# Patient Record
Sex: Female | Born: 1960 | Race: White | Hispanic: No | Marital: Married
Health system: Southern US, Community
[De-identification: ages and names within clinical notes are randomized; demographics above are authoritative.]

---

## 2015-11-01 ENCOUNTER — Other Ambulatory Visit (HOSPITAL_COMMUNITY): Payer: Medicaid Other

## 2015-11-01 ENCOUNTER — Inpatient Hospital Stay
Admission: AD | Admit: 2015-11-01 | Discharge: 2015-12-20 | Disposition: E | Payer: Medicaid Other | Source: Ambulatory Visit | Attending: Internal Medicine | Admitting: Internal Medicine

## 2015-11-01 DIAGNOSIS — Z4659 Encounter for fitting and adjustment of other gastrointestinal appliance and device: Secondary | ICD-10-CM

## 2015-11-01 DIAGNOSIS — R509 Fever, unspecified: Secondary | ICD-10-CM

## 2015-11-01 DIAGNOSIS — J9690 Respiratory failure, unspecified, unspecified whether with hypoxia or hypercapnia: Secondary | ICD-10-CM

## 2015-11-01 DIAGNOSIS — Z931 Gastrostomy status: Secondary | ICD-10-CM

## 2015-11-01 MED ORDER — DIATRIZOATE MEGLUMINE & SODIUM 66-10 % PO SOLN
ORAL | Status: AC
  Filled 2015-11-01: qty 30

## 2015-11-02 ENCOUNTER — Other Ambulatory Visit (HOSPITAL_COMMUNITY): Payer: Medicaid Other

## 2015-11-02 LAB — COMPREHENSIVE METABOLIC PANEL
ALK PHOS: 57 U/L (ref 38–126)
ALT: 20 U/L (ref 14–54)
AST: 28 U/L (ref 15–41)
Albumin: 2.5 g/dL — ABNORMAL LOW (ref 3.5–5.0)
Anion gap: 12 (ref 5–15)
BILIRUBIN TOTAL: 0.8 mg/dL (ref 0.3–1.2)
BUN: 99 mg/dL — AB (ref 6–20)
CALCIUM: 8.9 mg/dL (ref 8.9–10.3)
CHLORIDE: 102 mmol/L (ref 101–111)
CO2: 23 mmol/L (ref 22–32)
CREATININE: 1.56 mg/dL — AB (ref 0.44–1.00)
GFR, EST AFRICAN AMERICAN: 42 mL/min — AB (ref >60)
GFR, EST NON AFRICAN AMERICAN: 36 mL/min — AB (ref >60)
Glucose, Bld: 81 mg/dL (ref 65–99)
Potassium: 3.7 mmol/L (ref 3.5–5.1)
Sodium: 137 mmol/L (ref 135–145)
TOTAL PROTEIN: 4.3 g/dL — AB (ref 6.5–8.1)

## 2015-11-02 LAB — CBC
HCT: 25.1 % — ABNORMAL LOW (ref 36.0–46.0)
Hemoglobin: 8.1 g/dL — ABNORMAL LOW (ref 12.0–15.0)
MCH: 29.3 pg (ref 26.0–34.0)
MCHC: 32.3 g/dL (ref 30.0–36.0)
MCV: 90.9 fL (ref 78.0–100.0)
PLATELETS: 201 10*3/uL (ref 150–400)
RBC: 2.76 MIL/uL — ABNORMAL LOW (ref 3.87–5.11)
RDW: 18.7 % — AB (ref 11.5–15.5)
WBC: 11.8 10*3/uL — AB (ref 4.0–10.5)

## 2015-11-03 LAB — BASIC METABOLIC PANEL
ANION GAP: 11 (ref 5–15)
BUN: 84 mg/dL — ABNORMAL HIGH (ref 6–20)
CALCIUM: 9.5 mg/dL (ref 8.9–10.3)
CHLORIDE: 98 mmol/L — AB (ref 101–111)
CO2: 27 mmol/L (ref 22–32)
Creatinine, Ser: 1.35 mg/dL — ABNORMAL HIGH (ref 0.44–1.00)
GFR calc Af Amer: 49 mL/min — ABNORMAL LOW (ref >60)
GFR, EST NON AFRICAN AMERICAN: 43 mL/min — AB (ref >60)
GLUCOSE: 115 mg/dL — AB (ref 65–99)
POTASSIUM: 3.2 mmol/L — AB (ref 3.5–5.1)
Sodium: 136 mmol/L (ref 135–145)

## 2015-11-03 LAB — TROPONIN I
TROPONIN I: 0.37 ng/mL — AB (ref <0.03)
Troponin I: 0.33 ng/mL (ref <0.03)

## 2015-11-03 LAB — CK TOTAL AND CKMB (NOT AT ARMC)
CK TOTAL: 18 U/L — AB (ref 38–234)
CK, MB: 4 ng/mL (ref 0.5–5.0)
Relative Index: INVALID (ref 0.0–2.5)

## 2015-11-04 LAB — POTASSIUM: Potassium: 3 mmol/L — ABNORMAL LOW (ref 3.5–5.1)

## 2015-11-04 LAB — TROPONIN I: TROPONIN I: 0.27 ng/mL — AB (ref <0.03)

## 2015-11-05 LAB — BASIC METABOLIC PANEL
ANION GAP: 9 (ref 5–15)
BUN: 69 mg/dL — ABNORMAL HIGH (ref 6–20)
CALCIUM: 9.6 mg/dL (ref 8.9–10.3)
CHLORIDE: 94 mmol/L — AB (ref 101–111)
CO2: 33 mmol/L — AB (ref 22–32)
Creatinine, Ser: 1.23 mg/dL — ABNORMAL HIGH (ref 0.44–1.00)
GFR calc non Af Amer: 48 mL/min — ABNORMAL LOW (ref >60)
GFR, EST AFRICAN AMERICAN: 55 mL/min — AB (ref >60)
Glucose, Bld: 84 mg/dL (ref 65–99)
Potassium: 2.7 mmol/L — CL (ref 3.5–5.1)
Sodium: 136 mmol/L (ref 135–145)

## 2015-11-05 LAB — CBC
HEMATOCRIT: 26.3 % — AB (ref 36.0–46.0)
HEMOGLOBIN: 8.3 g/dL — AB (ref 12.0–15.0)
MCH: 29.5 pg (ref 26.0–34.0)
MCHC: 31.6 g/dL (ref 30.0–36.0)
MCV: 93.6 fL (ref 78.0–100.0)
Platelets: 183 10*3/uL (ref 150–400)
RBC: 2.81 MIL/uL — AB (ref 3.87–5.11)
RDW: 17.7 % — ABNORMAL HIGH (ref 11.5–15.5)
WBC: 14 10*3/uL — AB (ref 4.0–10.5)

## 2015-11-05 LAB — MAGNESIUM: Magnesium: 2.1 mg/dL (ref 1.7–2.4)

## 2015-11-06 LAB — BASIC METABOLIC PANEL
ANION GAP: 11 (ref 5–15)
BUN: 58 mg/dL — ABNORMAL HIGH (ref 6–20)
CALCIUM: 9.8 mg/dL (ref 8.9–10.3)
CO2: 33 mmol/L — AB (ref 22–32)
CREATININE: 1.06 mg/dL — AB (ref 0.44–1.00)
Chloride: 93 mmol/L — ABNORMAL LOW (ref 101–111)
GFR, EST NON AFRICAN AMERICAN: 57 mL/min — AB (ref >60)
Glucose, Bld: 84 mg/dL (ref 65–99)
Potassium: 3.4 mmol/L — ABNORMAL LOW (ref 3.5–5.1)
SODIUM: 137 mmol/L (ref 135–145)

## 2015-11-06 NOTE — Consult Note (Signed)
[facility] CONSULT NOTE    Date: 11/06/2015                  Patient Name:  Diane Cunningham  MRN: [id number]  DOB: 05-05-1960  Age / Sex: 55 y.o., female         PCP: No primary care provider on file.                 Service Requesting Consult: Hospitalist/Dr. Hijazi                 Reason for Consult: Acute renal failure            History of Present Illness: Patient is a 55 y.o. female with a PMHx of seizure disorder, fibromyalgia, who was admitted originally to [facility] in May of 2017. At that time she was found to have pneumonia and started on antibiotics. She developed respiratory failure and failed to wean from mechanical ventilation. She was then transferred to [facility] for ventilator weaning. Unfortunately she then developed fever and hypotension and was found to have worsening leukocytosis as well as acute renal failure. She was then sent over to [facility]  She was initially started on continuous renal replacement therapy. In addition she was found to have thrombocytopenia in the setting of multiple organ failure. She underwent plasma exchange x3. She was found to have Klebsiella sepsis.  It was felt that she may end up requiring additional renal replacement therapy however her urine output here has picked up to 2000 cc over the preceding 24 hours. She has considerable urine in the Foley bag today as well. She remains on the ventilator at the moment via a tracheostomy.  Medications: Meropenem 500 mg IV twice a day, polymyxin 1.4 million units twice a day, Humalog insulin, Xanax 0.5 mg every 6 hours, amiodarone 400 mg twice a day, Lasix 40 mg twice a day, Neurontin 200 mg each bedtime, heparin 5000 units every 8 hours,, Keppra 500 mg twice a day, magnesium oxide 800 mg twice a day, melatonin 3 mg each bedtime, metoprolol 25 mg every 8 hours, prednisone 40 mg daily, thiamine 100 mg daily   Allergies: No known drug  allergies   Past Medical History: Seizure disorder Fibromyalgia Recent Klebsiella sepsis Acute renal failure secondary to ATN Respiratory failure status post tracheostomy placement Thrombocytopenia treated with plasma exchange  Past Surgical History: Tracheostomy placement   Family History: Unable to obtain from the patient as she's currently on the ventilator.  Social History: Social History   Social History  . Marital Status: Married    Spouse Name: N/A  . Number of Children: N/A  . Years of Education: N/A   Occupational History  . Not on file.   Social History Main Topics  . Smoking status: Not on file  . Smokeless tobacco: Not on file  . Alcohol Use: Not on file  . Drug Use: Not on file  . Sexual Activity: Not on file   Other Topics Concern  . Not on file   Social History Narrative  . No narrative on file     Review of Systems: Unable to obtain from the patient is currently on a ventilator.  Vital Signs: Temperature 97.1 pulse 70 respirations 14 blood pressure 144/72 Weight trends: There were no vitals filed for this visit.  Physical Exam: General: Ill appearing  Head: Normocephalic, atraumatic.  Eyes: Anicteric, EOMI  Nose: Mucous membranes moist, not inflammed, nonerythematous. NG in  place  Throat: Oropharynx nonerythematous, no exudate appreciated.   Neck: Supple, trachea midline.  Lungs:  Scattered rhonchi, ventilator assisted.  Heart: S1S2 irregular  Abdomen:  BS normoactive. Soft, Nondistended, non-tender.  No masses or organomegaly.  Extremities: 2+ edema b/l LE's.  Neurologic: Awake, does follow simple commands with hands and feet  Skin: No visible rashes, scars.    Lab results: Basic Metabolic Panel:  Recent Labs Lab 11/03/15 0540 11/04/15 0500 11/05/15 0630 11/06/15 0645  NA 136  --  136 137  K 3.2* 3.0* 2.7* 3.4*  CL 98*  --  94* 93*  CO2 27  --  33* 33*  GLUCOSE 115*  --  84 84  BUN 84*  --  69* 58*  CREATININE 1.35*   --  1.23* 1.06*  CALCIUM 9.5  --  9.6 9.8  MG  --   --  2.1  --     Liver Function Tests:  Recent Labs Lab 11/02/15 0719  AST 28  ALT 20  ALKPHOS 57  BILITOT 0.8  PROT 4.3*  ALBUMIN 2.5*   No results for input(s): LIPASE, AMYLASE in the last 168 hours. No results for input(s): AMMONIA in the last 168 hours.  CBC:  Recent Labs Lab 11/02/15 0719 11/05/15 0630  WBC 11.8* 14.0*  HGB 8.1* 8.3*  HCT 25.1* 26.3*  MCV 90.9 93.6  PLT 201 183    Cardiac Enzymes:  Recent Labs Lab 11/03/15 0540 11/03/15 1445 11/04/15 0500  CKTOTAL  --  18*  --   CKMB  --  4.0  --   TROPONINI 0.37* 0.33* 0.27*    BNP: Invalid input(s): POCBNP  CBG: No results for input(s): GLUCAP in the last 168 hours.  Microbiology: No results found for this or any previous visit.  Coagulation Studies: No results for input(s): LABPROT, INR in the last 72 hours.  Urinalysis: No results for input(s): COLORURINE, LABSPEC, PHURINE, GLUCOSEU, HGBUR, BILIRUBINUR, KETONESUR, PROTEINUR, UROBILINOGEN, NITRITE, LEUKOCYTESUR in the last 72 hours.  Invalid input(s): APPERANCEUR    Imaging:  No results found.   Assessment & Plan: Pt is a 55 y.o. female with a PMHx of seizure disorder, fibromyalgia, who was admitted originally to [facility] in May of 2017. At that time she was found to have pneumonia and started on antibiotics. She developed respiratory failure and failed to wean from mechanical ventilation. She was then transferred to [facility] for ventilator weaning. Unfortunately she then developed fever and hypotension and was found to have worsening leukocytosis as well as acute renal failure. She was then sent over to [facility].  She was initially started on continuous renal replacement therapy. In addition she was found to have thrombocytopenia in the setting of multiple organ failure. She underwent plasma exchange x3. She was found to have Klebsiella sepsis.  1. Acute  renal failure. This appears to be multifactorial. Patient was treated with continuous renal replacement therapy at [facility]. - BUN/creatinine appear to be trending down. Good urine output also noted. Therefore no urgent indication for any further dialysis at this time. Avoid nephrotoxins as possible. Those medications for renal function.  2.acute respiratory failure. Patient has tracheostomy in place and remains on the ventilator. Weaning from the ventilator per pulmonary critical care.  3. Hypokalemia. Serum potassium 3.4 today. Continue repletion efforts and continue to monitor serum potassium.  4. Anemia unspecified. Likely related to acute renal flare and decreased erythropoietin production. No urgent indication to start Aranesp now however would  recommend continue monitoring of hemoglobin.  5. Thanks for consultation.

## 2015-11-07 ENCOUNTER — Other Ambulatory Visit (HOSPITAL_COMMUNITY): Payer: Medicaid Other

## 2015-11-08 NOTE — Consult Note (Signed)
[facility]  ROUNDING NOTE   Subjective:  The patient now off the ventilator. She is awake and alert. NG remains in place.    Objective:  Vital signs in last 24 hours:   temperature 97.3 pulse 64 respirations 16 blood pressure 133/76  Physical Exam: General: Chronically ill appearing  Head: Normocephalic, atraumatic. NG in place  Eyes: Anicteric  Neck: Tracheostomy in place  Lungs:  Scattered rhonchi, normal effort  Heart: S1S2 no rubs  Abdomen:  Soft, nontender, BS present   Extremities: 1+ peripheral edema.  Neurologic: Awake, alert, follows simple commands  Skin: No lesions       Basic Metabolic Panel:  Recent Labs Lab 11/02/15 0719 11/03/15 0540 11/04/15 0500 11/05/15 0630 11/06/15 0645  NA 137 136  --  136 137  K 3.7 3.2* 3.0* 2.7* 3.4*  CL 102 98*  --  94* 93*  CO2 23 27  --  33* 33*  GLUCOSE 81 115*  --  84 84  BUN 99* 84*  --  69* 58*  CREATININE 1.56* 1.35*  --  1.23* 1.06*  CALCIUM 8.9 9.5  --  9.6 9.8  MG  --   --   --  2.1  --     Liver Function Tests:  Recent Labs Lab 11/02/15 0719  AST 28  ALT 20  ALKPHOS 57  BILITOT 0.8  PROT 4.3*  ALBUMIN 2.5*   No results for input(s): LIPASE, AMYLASE in the last 168 hours. No results for input(s): AMMONIA in the last 168 hours.  CBC:  Recent Labs Lab 11/02/15 0719 11/05/15 0630  WBC 11.8* 14.0*  HGB 8.1* 8.3*  HCT 25.1* 26.3*  MCV 90.9 93.6  PLT 201 183    Cardiac Enzymes:  Recent Labs Lab 11/03/15 0540 11/03/15 1445 11/04/15 0500  CKTOTAL  --  18*  --   CKMB  --  4.0  --   TROPONINI 0.37* 0.33* 0.27*    BNP: Invalid input(s): POCBNP  CBG: No results for input(s): GLUCAP in the last 168 hours.  Microbiology: No results found for this or any previous visit.  Coagulation Studies: No results for input(s): LABPROT, INR in the last 72 hours.  Urinalysis: No results for input(s): COLORURINE, LABSPEC, PHURINE, GLUCOSEU, HGBUR, BILIRUBINUR, KETONESUR,  PROTEINUR, UROBILINOGEN, NITRITE, LEUKOCYTESUR in the last 72 hours.  Invalid input(s): APPERANCEUR    Imaging: Dg Abd Portable 1v  11/07/2015  CLINICAL DATA:  NG tube placement. EXAM: PORTABLE ABDOMEN - 1 VIEW COMPARISON:  10/19/2015 FINDINGS: The NG tube tip is in the body region of the stomach. Air-filled small bowel loops are noted in the central abdomen. No free air. IMPRESSION: NG tube tip is in the body region of the stomach. Electronically Signed   By: Rudie MeyerP.  Gallerani M.D.   On: 11/07/2015 15:16     Medications:       Assessment/ Plan:  55 y.o. female with a PMHx of seizure disorder, fibromyalgia, who was admitted originally to [facility] in May of 2017. At that time she was found to have pneumonia and started on antibiotics. She developed respiratory failure and failed to wean from mechanical ventilation. She was then transferred to [facility] for ventilator weaning. Unfortunately she then developed fever and hypotension and was found to have worsening leukocytosis as well as acute renal failure. She was then sent over to [facility]. She was initially started on continuous renal replacement therapy. In addition she was found to have thrombocytopenia in the  setting of multiple organ failure. She underwent plasma exchange x3. She was found to have Klebsiella sepsis.  1. Acute renal failure. This appears to be multifactorial. Patient was treated with continuous renal replacement therapy at [facility]. - BUN likely high secondary to tube feedings. Creatinine down to 1.06. Good urine output noted. No indication for dialysis at the moment. Continue to monitor renal function trend and avoid nephrotoxins as possible.  2.acute respiratory failure. Currently off the ventilator and on T. Piece. Continue to monitor her progress.  3. Hypokalemia. No new serum potassium today. Recommend continued monitoring.  4. Anemia unspecified.  Most recent hemoglobin  was 8.3. Continue to monitor. Consider Aranesp if hemoglobin remains low.   LOS:  Diane Cunningham 7/21/20179:02 AM

## 2015-11-11 LAB — BASIC METABOLIC PANEL
ANION GAP: 13 (ref 5–15)
BUN: 66 mg/dL — ABNORMAL HIGH (ref 6–20)
CO2: 36 mmol/L — ABNORMAL HIGH (ref 22–32)
Calcium: 9.9 mg/dL (ref 8.9–10.3)
Chloride: 89 mmol/L — ABNORMAL LOW (ref 101–111)
Creatinine, Ser: 1.74 mg/dL — ABNORMAL HIGH (ref 0.44–1.00)
GFR calc Af Amer: 36 mL/min — ABNORMAL LOW (ref >60)
GFR, EST NON AFRICAN AMERICAN: 31 mL/min — AB (ref >60)
GLUCOSE: 82 mg/dL (ref 65–99)
POTASSIUM: 2.3 mmol/L — AB (ref 3.5–5.1)
Sodium: 138 mmol/L (ref 135–145)

## 2015-11-11 LAB — CBC
HCT: 21.7 % — ABNORMAL LOW (ref 36.0–46.0)
Hemoglobin: 7.1 g/dL — ABNORMAL LOW (ref 12.0–15.0)
MCH: 29.7 pg (ref 26.0–34.0)
MCHC: 32.7 g/dL (ref 30.0–36.0)
MCV: 90.8 fL (ref 78.0–100.0)
PLATELETS: 191 10*3/uL (ref 150–400)
RBC: 2.39 MIL/uL — AB (ref 3.87–5.11)
RDW: 15.6 % — ABNORMAL HIGH (ref 11.5–15.5)
WBC: 7 10*3/uL (ref 4.0–10.5)

## 2015-11-11 NOTE — Progress Notes (Signed)
[facility]  ROUNDING NOTE   Subjective:  The patient now off the ventilator. She is awake and alert. NG remains in place. She has a speech valve and is able to verbalize Today's labs show low potassium at 2.3 Serum creatinine has increased to 1.74 compared to 1.06 on July 19 Urine output recorded at 2270 cc last 24 hours Prior to that urine output was 1850 cc   Objective:  Vital signs in last 24 hours:  temperature 98.4 pulse 54, respirations 15, blood pressure 109/59   Physical Exam: General: Chronically ill appearing  Head: Normocephalic, atraumatic. NG in place  Eyes: Anicteric  Neck: Tracheostomy in place with speech valve  Lungs:  Scattered rhonchi, normal effort  Heart: S1S2 no rubs  Abdomen:  Soft, nontender, BS present , Colostomy present  Extremities: 1+ peripheral edema.  Neurologic: Awake, alert, follows simple commands  Skin: No acute lesions       Basic Metabolic Panel:  Recent Labs Lab 11/05/15 0630 11/06/15 0645 11/11/15 0532  NA 136 137 138  K 2.7* 3.4* 2.3*  CL 94* 93* 89*  CO2 33* 33* 36*  GLUCOSE 84 84 82  BUN 69* 58* 66*  CREATININE 1.23* 1.06* 1.74*  CALCIUM 9.6 9.8 9.9  MG 2.1  --   --     Liver Function Tests: No results for input(s): AST, ALT, ALKPHOS, BILITOT, PROT, ALBUMIN in the last 168 hours. No results for input(s): LIPASE, AMYLASE in the last 168 hours. No results for input(s): AMMONIA in the last 168 hours.  CBC:  Recent Labs Lab 11/05/15 0630 11/11/15 0532  WBC 14.0* 7.0  HGB 8.3* 7.1*  HCT 26.3* 21.7*  MCV 93.6 90.8  PLT 183 191    Cardiac Enzymes: No results for input(s): CKTOTAL, CKMB, CKMBINDEX, TROPONINI in the last 168 hours.  BNP: Invalid input(s): POCBNP  CBG: No results for input(s): GLUCAP in the last 168 hours.  Microbiology: No results found for this or any previous visit.  Coagulation Studies: No results for input(s): LABPROT, INR in the last 72 hours.  Urinalysis: No  results for input(s): COLORURINE, LABSPEC, PHURINE, GLUCOSEU, HGBUR, BILIRUBINUR, KETONESUR, PROTEINUR, UROBILINOGEN, NITRITE, LEUKOCYTESUR in the last 72 hours.  Invalid input(s): APPERANCEUR    Imaging: No results found.   Medications:       Assessment/ Plan:  55 y.o. female with a PMHx of seizure disorder, fibromyalgia, who was admitted originally to [facility] in May of 2017. At that time she was found to have pneumonia and started on antibiotics. She developed respiratory failure and failed to wean from mechanical ventilation. She was then transferred to [facility] for ventilator weaning. Unfortunately she then developed fever and hypotension and was found to have worsening leukocytosis as well as acute renal failure. She was then sent over to [facility]. She was initially started on continuous renal replacement therapy. In addition she was found to have thrombocytopenia in the setting of multiple organ failure. She underwent plasma exchange x3. She was found to have Klebsiella sepsis.  1. Acute renal failure. This appears to be multifactorial. Patient was treated with continuous renal replacement therapy at [facility]. -  Increasing creatinine and BUN is likely secondary to volume depletion We will discontinue furosemide Electrolytes and volume status are acceptable No acute indication for dialysis at present   2.acute respiratory failure. Currently off the ventilator - Continue to monitor her progress.  3. Hypokalemia.  Low due to lasix Replace as needed Should  correct now that lasix is stopped  4. Anemia unspecified.  Most recent hemoglobin was 7.1  Continue to monitor. Consider Aranesp if hemoglobin remains low.   LOS: 0 Diane Cunningham 7/24/20172:48 PM

## 2015-11-12 LAB — BASIC METABOLIC PANEL
ANION GAP: 9 (ref 5–15)
BUN: 59 mg/dL — ABNORMAL HIGH (ref 6–20)
CALCIUM: 9.8 mg/dL (ref 8.9–10.3)
CO2: 34 mmol/L — ABNORMAL HIGH (ref 22–32)
Chloride: 95 mmol/L — ABNORMAL LOW (ref 101–111)
Creatinine, Ser: 1.74 mg/dL — ABNORMAL HIGH (ref 0.44–1.00)
GFR, EST AFRICAN AMERICAN: 36 mL/min — AB (ref >60)
GFR, EST NON AFRICAN AMERICAN: 31 mL/min — AB (ref >60)
Glucose, Bld: 83 mg/dL (ref 65–99)
POTASSIUM: 3.7 mmol/L (ref 3.5–5.1)
SODIUM: 138 mmol/L (ref 135–145)

## 2015-11-12 LAB — MAGNESIUM: MAGNESIUM: 2.2 mg/dL (ref 1.7–2.4)

## 2015-11-12 LAB — POTASSIUM: POTASSIUM: 3.4 mmol/L — AB (ref 3.5–5.1)

## 2015-11-13 ENCOUNTER — Other Ambulatory Visit (HOSPITAL_COMMUNITY): Payer: Medicaid Other

## 2015-11-13 LAB — BASIC METABOLIC PANEL
Anion gap: 13 (ref 5–15)
BUN: 54 mg/dL — AB (ref 6–20)
CHLORIDE: 95 mmol/L — AB (ref 101–111)
CO2: 31 mmol/L (ref 22–32)
CREATININE: 1.59 mg/dL — AB (ref 0.44–1.00)
Calcium: 9.9 mg/dL (ref 8.9–10.3)
GFR calc Af Amer: 41 mL/min — ABNORMAL LOW (ref >60)
GFR calc non Af Amer: 35 mL/min — ABNORMAL LOW (ref >60)
Glucose, Bld: 72 mg/dL (ref 65–99)
Potassium: 4.4 mmol/L (ref 3.5–5.1)
Sodium: 139 mmol/L (ref 135–145)

## 2015-11-13 LAB — MAGNESIUM: Magnesium: 2.2 mg/dL (ref 1.7–2.4)

## 2015-11-13 NOTE — Progress Notes (Signed)
[facility]  ROUNDING NOTE   Subjective:  The patient now off the ventilator. She is awake and alert.   She has a speech valve and is able to verbalize Today's labs show low potassium has improved to 4.4 Serum creatinine has improved to 1.59 after diuretic was stopped     Objective:  Vital signs in last 24 hours:  temperature 97.5 pulse 56, respirations 19, blood pressure 102/52   Physical Exam: General: Chronically ill appearing  Head: Normocephalic, atraumatic.    Eyes: Anicteric  Neck: Tracheostomy in place with speech valve  Lungs:  Scattered rhonchi, normal effort  Heart: S1S2 no rubs  Abdomen:  Soft, nontender, BS present , Colostomy present  Extremities: 1+ peripheral edema.  Neurologic: Awake, alert, follows simple commands  Skin: No acute lesions   Rt IJ dialysis catheter    Basic Metabolic Panel:  Recent Labs Lab 11/11/15 0532 11/12/15 0001 11/12/15 0830 11/13/15 0440  NA 138  --  138 139  K 2.3* 3.4* 3.7 4.4  CL 89*  --  95* 95*  CO2 36*  --  34* 31  GLUCOSE 82  --  83 72  BUN 66*  --  59* 54*  CREATININE 1.74*  --  1.74* 1.59*  CALCIUM 9.9  --  9.8 9.9  MG  --   --  2.2 2.2    Liver Function Tests: No results for input(s): AST, ALT, ALKPHOS, BILITOT, PROT, ALBUMIN in the last 168 hours. No results for input(s): LIPASE, AMYLASE in the last 168 hours. No results for input(s): AMMONIA in the last 168 hours.  CBC:  Recent Labs Lab 11/11/15 0532  WBC 7.0  HGB 7.1*  HCT 21.7*  MCV 90.8  PLT 191    Cardiac Enzymes: No results for input(s): CKTOTAL, CKMB, CKMBINDEX, TROPONINI in the last 168 hours.  BNP: Invalid input(s): POCBNP  CBG: No results for input(s): GLUCAP in the last 168 hours.  Microbiology: No results found for this or any previous visit.  Coagulation Studies: No results for input(s): LABPROT, INR in the last 72 hours.  Urinalysis: No results for input(s): COLORURINE, LABSPEC, PHURINE, GLUCOSEU, HGBUR,  BILIRUBINUR, KETONESUR, PROTEINUR, UROBILINOGEN, NITRITE, LEUKOCYTESUR in the last 72 hours.  Invalid input(s): APPERANCEUR    Imaging: No results found.   Medications:       Assessment/ Plan:  55 y.o. female with a PMHx of seizure disorder, fibromyalgia, who was admitted originally to [facility] in May of 2017. At that time she was found to have pneumonia and started on antibiotics. She developed respiratory failure and failed to wean from mechanical ventilation. She was then transferred to [facility] for ventilator weaning. Unfortunately she then developed fever and hypotension and was found to have worsening leukocytosis as well as acute renal failure. She was then sent over to [facility]. She was initially started on continuous renal replacement therapy. In addition she was found to have thrombocytopenia in the setting of multiple organ failure. She underwent plasma exchange x3. She was found to have Klebsiella sepsis.  1. Acute renal failure. This appears to be multifactorial. Patient was treated with continuous renal replacement therapy at [facility]. -  Increasing creatinine and BUN is likely secondary to volume depletion - stay off of furosemide Electrolytes and volume status are acceptable No acute indication for dialysis at present Will probably remove dialysis cathter in next 1-2 days if Cr remains stable  2. Acute respiratory failure. Currently off the ventilator -  Continue to monitor her progress.  3. Hypokalemia.  Corrected now      LOS: 0 Aristides Luckey 7/26/20173:36 PM

## 2015-11-15 NOTE — Progress Notes (Signed)
[facility]  ROUNDING NOTE   Subjective:  The patient now off the ventilator. She is awake and alert.   She has a speech valve and is able to verbalize. States she can eat pureed diet Labs from 7/26 show K 4.4, Cr improved to 1.59 after diuretic was stopped  no new labs today   Objective:  Vital signs in last 24 hours:  Temperature 97.3, pulse 74, respirations 16, blood pressure 108/68  Physical Exam: General: Chronically ill appearing  Head: Normocephalic, atraumatic.    Eyes: Anicteric  Neck: Tracheostomy in place with speech valve  Lungs:  Scattered rhonchi, normal effort  Heart: S1S2 no rubs  Abdomen:  Soft, nontender, BS present , Colostomy present  Extremities: 1+ peripheral edema.  Neurologic: Awake, alert, follows simple commands  Skin: No acute lesions   Rt IJ dialysis catheter    Basic Metabolic Panel:  Recent Labs Lab 11/11/15 0532 11/12/15 0001 11/12/15 0830 11/13/15 0440  NA 138  --  138 139  K 2.3* 3.4* 3.7 4.4  CL 89*  --  95* 95*  CO2 36*  --  34* 31  GLUCOSE 82  --  83 72  BUN 66*  --  59* 54*  CREATININE 1.74*  --  1.74* 1.59*  CALCIUM 9.9  --  9.8 9.9  MG  --   --  2.2 2.2    Liver Function Tests: No results for input(s): AST, ALT, ALKPHOS, BILITOT, PROT, ALBUMIN in the last 168 hours. No results for input(s): LIPASE, AMYLASE in the last 168 hours. No results for input(s): AMMONIA in the last 168 hours.  CBC:  Recent Labs Lab 11/11/15 0532  WBC 7.0  HGB 7.1*  HCT 21.7*  MCV 90.8  PLT 191    Cardiac Enzymes: No results for input(s): CKTOTAL, CKMB, CKMBINDEX, TROPONINI in the last 168 hours.  BNP: Invalid input(s): POCBNP  CBG: No results for input(s): GLUCAP in the last 168 hours.  Microbiology: No results found for this or any previous visit.  Coagulation Studies: No results for input(s): LABPROT, INR in the last 72 hours.  Urinalysis: No results for input(s): COLORURINE, LABSPEC, PHURINE, GLUCOSEU,  HGBUR, BILIRUBINUR, KETONESUR, PROTEINUR, UROBILINOGEN, NITRITE, LEUKOCYTESUR in the last 72 hours.  Invalid input(s): APPERANCEUR    Imaging: No results found.   Medications:       Assessment/ Plan:  55 y.o. female with a PMHx of seizure disorder, fibromyalgia, who was admitted originally to [facility] in May of 2017. At that time she was found to have pneumonia and started on antibiotics. She developed respiratory failure and failed to wean from mechanical ventilation. She was then transferred to [facility] for ventilator weaning. Unfortunately she then developed fever and hypotension and was found to have worsening leukocytosis as well as acute renal failure. She was then sent over to [facility]. She was initially started on continuous renal replacement therapy. In addition she was found to have thrombocytopenia in the setting of multiple organ failure. She underwent plasma exchange x3. She was found to have Klebsiella sepsis.  1. Acute renal failure. This appears to be multifactorial. Patient was treated with continuous renal replacement therapy at [facility]. -  Increasing creatinine and BUN is likely secondary to volume depletion - stay off of furosemide Electrolytes and volume status are acceptable No acute indication for dialysis at present Will probably remove dialysis cathterom Monday if Cr remains stable No new labs since 7/26 Will order  2. Acute  respiratory failure. Currently off the ventilator - Continue to monitor her progress.  3. Hypokalemia.  Corrected with replacement     LOS: 0 Diane Cunningham 7/28/20173:36 PM

## 2015-11-16 ENCOUNTER — Other Ambulatory Visit (HOSPITAL_COMMUNITY): Payer: Medicaid Other

## 2015-11-16 LAB — BASIC METABOLIC PANEL
ANION GAP: 9 (ref 5–15)
BUN: 30 mg/dL — ABNORMAL HIGH (ref 6–20)
CALCIUM: 9.3 mg/dL (ref 8.9–10.3)
CHLORIDE: 103 mmol/L (ref 101–111)
CO2: 30 mmol/L (ref 22–32)
CREATININE: 1.64 mg/dL — AB (ref 0.44–1.00)
GFR calc non Af Amer: 34 mL/min — ABNORMAL LOW (ref >60)
GFR, EST AFRICAN AMERICAN: 39 mL/min — AB (ref >60)
Glucose, Bld: 74 mg/dL (ref 65–99)
Potassium: 3 mmol/L — ABNORMAL LOW (ref 3.5–5.1)
SODIUM: 142 mmol/L (ref 135–145)

## 2015-11-16 LAB — CBC
HCT: 22.7 % — ABNORMAL LOW (ref 36.0–46.0)
HEMOGLOBIN: 7.1 g/dL — AB (ref 12.0–15.0)
MCH: 29.7 pg (ref 26.0–34.0)
MCHC: 31.3 g/dL (ref 30.0–36.0)
MCV: 95 fL (ref 78.0–100.0)
Platelets: 206 10*3/uL (ref 150–400)
RBC: 2.39 MIL/uL — ABNORMAL LOW (ref 3.87–5.11)
RDW: 16.2 % — AB (ref 11.5–15.5)
WBC: 4.9 10*3/uL (ref 4.0–10.5)

## 2015-11-17 LAB — RENAL FUNCTION PANEL
ANION GAP: 10 (ref 5–15)
Albumin: 3.1 g/dL — ABNORMAL LOW (ref 3.5–5.0)
BUN: 25 mg/dL — AB (ref 6–20)
CALCIUM: 9 mg/dL (ref 8.9–10.3)
CO2: 26 mmol/L (ref 22–32)
Chloride: 107 mmol/L (ref 101–111)
Creatinine, Ser: 1.59 mg/dL — ABNORMAL HIGH (ref 0.44–1.00)
GFR calc Af Amer: 41 mL/min — ABNORMAL LOW (ref >60)
GFR calc non Af Amer: 35 mL/min — ABNORMAL LOW (ref >60)
GLUCOSE: 76 mg/dL (ref 65–99)
Phosphorus: 4.4 mg/dL (ref 2.5–4.6)
Potassium: 5.1 mmol/L (ref 3.5–5.1)
SODIUM: 143 mmol/L (ref 135–145)

## 2015-11-18 LAB — BASIC METABOLIC PANEL
Anion gap: 5 (ref 5–15)
BUN: 23 mg/dL — AB (ref 6–20)
CHLORIDE: 110 mmol/L (ref 101–111)
CO2: 30 mmol/L (ref 22–32)
CREATININE: 1.59 mg/dL — AB (ref 0.44–1.00)
Calcium: 9.1 mg/dL (ref 8.9–10.3)
GFR calc Af Amer: 41 mL/min — ABNORMAL LOW (ref >60)
GFR calc non Af Amer: 35 mL/min — ABNORMAL LOW (ref >60)
Glucose, Bld: 77 mg/dL (ref 65–99)
Potassium: 4.5 mmol/L (ref 3.5–5.1)
SODIUM: 145 mmol/L (ref 135–145)

## 2015-11-19 LAB — MAGNESIUM: MAGNESIUM: 1.9 mg/dL (ref 1.7–2.4)

## 2015-11-19 DEATH — deceased

## 2015-11-20 NOTE — Progress Notes (Signed)
[facility]  ROUNDING NOTE   Subjective:  Patient resting on the ventilator. She still has a Passy-Muir valve in place. Most recent creatinine was 1.59. We discussed removing dialysis catheter today.    Objective:  Vital signs in last 24 hours:  Temperature 99.3 pulse 82 respirations 26 blood pressure 115/68  Physical Exam: General: No acute distress  Head: Normocephalic, atraumatic. Moist oral mucosal membranes  Eyes: Anicteric  Neck: Supple, trachea midline  Lungs:  Clear to auscultation, normal effort  Heart: S1S2 no rubs  Abdomen:  Soft, nontender, bowel sounds present  Extremities:  peripheral edema.  Neurologic: Nonfocal, moving all four extremities  Skin: No lesions  Access: Right internal jugular temporary dialysis catheter    Basic Metabolic Panel:  Recent Labs Lab 11/16/15 0717 11/17/15 1524 11/18/15 0459 11/19/15 1052  NA 142 143 145  --   K 3.0* 5.1 4.5  --   CL 103 107 110  --   CO2 --   GLUCOSE 74 76 77  --   BUN 30* 25* 23*  --   CREATININE 1.64* 1.59* 1.59*  --   CALCIUM 9.3 9.0 9.1  --   MG  --   --   --  1.9  PHOS  --  4.4  --   --     Liver Function Tests:  Recent Labs Lab 11/17/15 1524  ALBUMIN 3.1*   No results for input(s): LIPASE, AMYLASE in the last 168 hours. No results for input(s): AMMONIA in the last 168 hours.  CBC:  Recent Labs Lab 11/16/15 0717  WBC 4.9  HGB 7.1*  HCT 22.7*  MCV 95.0  PLT 206    Cardiac Enzymes: No results for input(s): CKTOTAL, CKMB, CKMBINDEX, TROPONINI in the last 168 hours.  BNP: Invalid input(s): POCBNP  CBG: No results for input(s): GLUCAP in the last 168 hours.  Microbiology: No results found for this or any previous visit.  Coagulation Studies: No results for input(s): LABPROT, INR in the last 72 hours.  Urinalysis: No results for input(s): COLORURINE, LABSPEC, PHURINE, GLUCOSEU, HGBUR, BILIRUBINUR, KETONESUR, PROTEINUR, UROBILINOGEN, NITRITE,  LEUKOCYTESUR in the last 72 hours.  Invalid input(s): APPERANCEUR    Imaging: No results found.   Medications:       Assessment/ Plan:  55 y.o. female with a PMHx of seizure disorder, fibromyalgia, who was admitted originally to [facility] in May of 2017. At that time she was found to have pneumonia and started on antibiotics. She developed respiratory failure and failed to wean from mechanical ventilation. She was then transferred to [facility] for ventilator weaning. Unfortunately she then developed fever and hypotension and was found to have worsening leukocytosis as well as acute renal failure. She was then sent over to [facility]. She was initially started on continuous renal replacement therapy. In addition she was found to have thrombocytopenia in the setting of multiple organ failure. She underwent plasma exchange x3. She was found to have Klebsiella sepsis.  1. Acute renal failure. This appears to be multifactorial. Patient was treated with continuous renal replacement therapy at [facility]. - at this point in time we will go ahead and discontinue temporary dialysis catheter. Continue to periodically monitor renal function. The patient may develop a new baseline creatinine given recent severe acute renal failure.  2. Acute respiratory failure. Patient continues to do well with Passy-Muir valve in place. Continue to monitor respiratory status.  3. Hypokalemia. Most recent serum potassium  was found to be 4.5 which is acceptable. Continue to periodically monitor.   LOS: 0 Cortney Mckinney 8/2/20179:40 AM

## 2015-11-22 NOTE — Progress Notes (Signed)
  [facility]  ROUNDING NOTE   Subjective:  Patient doing much better. She is now decannulated and temporary paralysis catheter has been removed.    Objective:  Vital signs in last 24 hours:  Temperature 90.8 pulse 84 respirations 22 blood pressure 113/76  Physical Exam: General: No acute distress  Head: Normocephalic, atraumatic. Moist oral mucosal membranes  Eyes: Anicteric  Neck: decannulated  Lungs:  Clear to auscultation, normal effort  Heart: S1S2 no rubs  Abdomen:  Soft, nontender, bowel sounds present  Extremities: 1+ peripheral edema.  Neurologic: Nonfocal, moving all four extremities  Skin: No lesions  Access: Right internal jugular temporary dialysis catheter now removed    Basic Metabolic Panel:  Recent Labs Lab 11/16/15 0717 11/17/15 1524 11/18/15 0459 11/19/15 1052  NA 142 143 145  --   K 3.0* 5.1 4.5  --   CL 103 107 110  --   CO2 30 26 30   --   GLUCOSE 74 76 77  --   BUN 30* 25* 23*  --   CREATININE 1.64* 1.59* 1.59*  --   CALCIUM 9.3 9.0 9.1  --   MG  --   --   --  1.9  PHOS  --  4.4  --   --     Liver Function Tests:  Recent Labs Lab 11/17/15 1524  ALBUMIN 3.1*   No results for input(s): LIPASE, AMYLASE in the last 168 hours. No results for input(s): AMMONIA in the last 168 hours.  CBC:  Recent Labs Lab 11/16/15 0717  WBC 4.9  HGB 7.1*  HCT 22.7*  MCV 95.0  PLT 206    Cardiac Enzymes: No results for input(s): CKTOTAL, CKMB, CKMBINDEX, TROPONINI in the last 168 hours.  BNP: Invalid input(s): POCBNP  CBG: No results for input(s): GLUCAP in the last 168 hours.  Microbiology: No results found for this or any previous visit.  Coagulation Studies: No results for input(s): LABPROT, INR in the last 72 hours.  Urinalysis: No results for input(s): COLORURINE, LABSPEC, PHURINE, GLUCOSEU, HGBUR, BILIRUBINUR, KETONESUR, PROTEINUR, UROBILINOGEN, NITRITE, LEUKOCYTESUR in the last 72 hours.  Invalid input(s):  APPERANCEUR    Imaging: No results found.   Medications:       Assessment/ Plan:  55 y.o. female with a PMHx of seizure disorder, fibromyalgia, who was admitted originally to [facility] in May of 2017. At that time she was found to have pneumonia and started on antibiotics. She developed respiratory failure and failed to wean from mechanical ventilation. She was then transferred to [facility] for ventilator weaning. Unfortunately she then developed fever and hypotension and was found to have worsening leukocytosis as well as acute renal failure. She was then sent over to [facility]. She was initially started on continuous renal replacement therapy. In addition she was found to have thrombocytopenia in the setting of multiple organ failure. She underwent plasma exchange x3. She was found to have Klebsiella sepsis.  1. Acute renal failure. This appears to be multifactorial. Patient was treated with continuous renal replacement therapy at [facility]. - temporalis catheter has been removed.  No new renal function testing today. Consider periodically monitoring renal function.  2. Acute respiratory failure. Patient status post decannulation doing quite well. Continue to monitor respiratory status.  3. Hypokalemia. Recommend checking serum potassium on Monday.  LOS: 0 Desmond Tufano 8/4/20179:03 AM

## 2015-11-25 LAB — RENAL FUNCTION PANEL
ALBUMIN: 2.6 g/dL — AB (ref 3.5–5.0)
ANION GAP: 6 (ref 5–15)
BUN: 18 mg/dL (ref 6–20)
CALCIUM: 8.7 mg/dL — AB (ref 8.9–10.3)
CO2: 26 mmol/L (ref 22–32)
Chloride: 111 mmol/L (ref 101–111)
Creatinine, Ser: 1.56 mg/dL — ABNORMAL HIGH (ref 0.44–1.00)
GFR, EST AFRICAN AMERICAN: 42 mL/min — AB (ref >60)
GFR, EST NON AFRICAN AMERICAN: 36 mL/min — AB (ref >60)
GLUCOSE: 81 mg/dL (ref 65–99)
Phosphorus: 4.2 mg/dL (ref 2.5–4.6)
Potassium: 3.6 mmol/L (ref 3.5–5.1)
SODIUM: 143 mmol/L (ref 135–145)

## 2015-11-25 NOTE — Progress Notes (Signed)
  [facility]  ROUNDING NOTE   Subjective:  Renal function was rechecked this a.m. Creatinine remained stable at 1.5. Overall progressing quite well after decannulation and dialysis catheter removal.    Objective:  Vital signs in last 24 hours:  Temperature 90.8 pulse 84 respirations 22 blood pressure 113/76  Physical Exam: General: No acute distress  Head: Normocephalic, atraumatic. Moist oral mucosal membranes  Eyes: Anicteric  Neck: decannulated  Lungs:  Clear to auscultation, normal effort  Heart: S1S2 no rubs  Abdomen:  Soft, nontender, bowel sounds present  Extremities: 1+ peripheral edema.  Neurologic: Nonfocal, moving all four extremities  Skin: No lesions  Access: none    Basic Metabolic Panel:  Recent Labs Lab 11/19/15 1052 11/25/15 0537  NA  --  143  K  --  3.6  CL  --  111  CO2  --  26  GLUCOSE  --  81  BUN  --  18  CREATININE  --  1.56*  CALCIUM  --  8.7*  MG 1.9  --   PHOS  --  4.2    Liver Function Tests:  Recent Labs Lab 11/25/15 0537  ALBUMIN 2.6*   No results for input(s): LIPASE, AMYLASE in the last 168 hours. No results for input(s): AMMONIA in the last 168 hours.  CBC: No results for input(s): WBC, NEUTROABS, HGB, HCT, MCV, PLT in the last 168 hours.  Cardiac Enzymes: No results for input(s): CKTOTAL, CKMB, CKMBINDEX, TROPONINI in the last 168 hours.  BNP: Invalid input(s): POCBNP  CBG: No results for input(s): GLUCAP in the last 168 hours.  Microbiology: No results found for this or any previous visit.  Coagulation Studies: No results for input(s): LABPROT, INR in the last 72 hours.  Urinalysis: No results for input(s): COLORURINE, LABSPEC, PHURINE, GLUCOSEU, HGBUR, BILIRUBINUR, KETONESUR, PROTEINUR, UROBILINOGEN, NITRITE, LEUKOCYTESUR in the last 72 hours.  Invalid input(s): APPERANCEUR    Imaging: No results found.   Medications:       Assessment/ Plan:  55 y.o. female with a PMHx of seizure  disorder, fibromyalgia, who was admitted originally to [facility] in May of 2017. At that time she was found to have pneumonia and started on antibiotics. She developed respiratory failure and failed to wean from mechanical ventilation. She was then transferred to [facility] for ventilator weaning. Unfortunately she then developed fever and hypotension and was found to have worsening leukocytosis as well as acute renal failure. She was then sent over to [facility]. She was initially started on continuous renal replacement therapy. In addition she was found to have thrombocytopenia in the setting of multiple organ failure. She underwent plasma exchange x3. She was found to have Klebsiella sepsis.  1. Acute renal failure. This appears to be multifactorial. Patient was treated with continuous renal replacement therapy at [facility]. - renal function was repeat today. Creatinine stable at 1.5. Temporary Dialysis catheter was discontinued last week. Overall the patient appears to be progressing quite well. This may be the patient's new baseline creatinine given the severity of recent acute renal failure. Continue to periodically monitor renal function.  2. Acute respiratory failure. Patient continues to do quite well post decannulation. Continue to monitor respiratory status.  3. Hypokalemia. Potassium remains low normal at 3.6. Continue to periodically monitor.    LOS: 0 Ashonti Leandro 8/7/20174:19 PM

## 2015-11-26 ENCOUNTER — Other Ambulatory Visit (HOSPITAL_COMMUNITY): Payer: Medicaid Other

## 2015-11-26 LAB — URINALYSIS, ROUTINE W REFLEX MICROSCOPIC
BILIRUBIN URINE: NEGATIVE
GLUCOSE, UA: NEGATIVE mg/dL
HGB URINE DIPSTICK: NEGATIVE
KETONES UR: NEGATIVE mg/dL
Nitrite: NEGATIVE
PROTEIN: NEGATIVE mg/dL
Specific Gravity, Urine: 1.022 (ref 1.005–1.030)
pH: 5.5 (ref 5.0–8.0)

## 2015-11-26 LAB — BASIC METABOLIC PANEL
ANION GAP: 11 (ref 5–15)
BUN: 18 mg/dL (ref 6–20)
CALCIUM: 9 mg/dL (ref 8.9–10.3)
CO2: 24 mmol/L (ref 22–32)
CREATININE: 1.63 mg/dL — AB (ref 0.44–1.00)
Chloride: 108 mmol/L (ref 101–111)
GFR calc Af Amer: 39 mL/min — ABNORMAL LOW (ref >60)
GFR calc non Af Amer: 34 mL/min — ABNORMAL LOW (ref >60)
Glucose, Bld: 91 mg/dL (ref 65–99)
POTASSIUM: 3 mmol/L — AB (ref 3.5–5.1)
SODIUM: 143 mmol/L (ref 135–145)

## 2015-11-26 LAB — CBC
HCT: 27.7 % — ABNORMAL LOW (ref 36.0–46.0)
Hemoglobin: 8.5 g/dL — ABNORMAL LOW (ref 12.0–15.0)
MCH: 29.3 pg (ref 26.0–34.0)
MCHC: 30.7 g/dL (ref 30.0–36.0)
MCV: 95.5 fL (ref 78.0–100.0)
PLATELETS: 201 10*3/uL (ref 150–400)
RBC: 2.9 MIL/uL — ABNORMAL LOW (ref 3.87–5.11)
RDW: 16.4 % — ABNORMAL HIGH (ref 11.5–15.5)
WBC: 8.7 10*3/uL (ref 4.0–10.5)

## 2015-11-26 LAB — URINE MICROSCOPIC-ADD ON

## 2015-11-26 LAB — MAGNESIUM: Magnesium: 2.1 mg/dL (ref 1.7–2.4)

## 2015-11-27 LAB — BLOOD GAS, ARTERIAL
ACID-BASE DEFICIT: 1.5 mmol/L (ref 0.0–2.0)
Acid-base deficit: 3.3 mmol/L — ABNORMAL HIGH (ref 0.0–2.0)
BICARBONATE: 22.4 meq/L (ref 20.0–24.0)
Bicarbonate: 22.1 mEq/L (ref 20.0–24.0)
O2 CONTENT: 4 L/min
O2 CONTENT: 4 L/min
O2 SAT: 86.8 %
O2 SAT: 86.9 %
PATIENT TEMPERATURE: 98.6
PO2 ART: 53.6 mmHg — AB (ref 80.0–100.0)
Patient temperature: 98.6
TCO2: 23.5 mmol/L (ref 0–100)
TCO2: 23.5 mmol/L (ref 0–100)
pCO2 arterial: 35.8 mmHg (ref 35.0–45.0)
pCO2 arterial: 46.1 mmHg — ABNORMAL HIGH (ref 35.0–45.0)
pH, Arterial: 7.413 (ref 7.350–7.450)
pH, Arterial: 7.301 — ABNORMAL LOW (ref 7.350–7.450)
pO2, Arterial: 59.5 mmHg — ABNORMAL LOW (ref 80.0–100.0)

## 2015-11-27 LAB — POTASSIUM: Potassium: 4.5 mmol/L (ref 3.5–5.1)

## 2015-11-27 LAB — URINE CULTURE

## 2015-11-27 NOTE — Progress Notes (Signed)
 [facility]  ROUNDING NOTE   Subjective:  Patient having low grade fever. Urinalysis showed yeast with too numerous to count WBCs. Patient be started on Diflucan In addition chest x-ray shows the possibility of atelectasis versus pneumonia.    Objective:  Vital signs in last 24 hours:  Temperature 90.8 pulse 84 respirations 22 blood pressure 113/76  Physical Exam: General: No acute distress  Head: Normocephalic, atraumatic. Moist oral mucosal membranes  Eyes: Anicteric  Neck: decannulated  Lungs:  Clear to auscultation, normal effort  Heart: S1S2 no rubs  Abdomen:  Soft, nontender, bowel sounds present  Extremities: Trace peripheral edema.  Neurologic: Nonfocal, moving all four extremities  Skin: No lesions  Access: none    Basic Metabolic Panel:  Recent Labs Lab 11/25/15 0537 11/26/15 1313  0615  NA 143 143  --   K 3.6 3.0* 4.5  CL 111 108  --   CO2 26 24  --   GLUCOSE 81 91  --   BUN 18 18  --   CREATININE 1.56* 1.63*  --   CALCIUM 8.7* 9.0  --   MG  --  2.1  --   PHOS 4.2  --   --     Liver Function Tests:  Recent Labs Lab 11/25/15 0537  ALBUMIN 2.6*   No results for input(s): LIPASE, AMYLASE in the last 168 hours. No results for input(s): AMMONIA in the last 168 hours.  CBC:  Recent Labs Lab 11/26/15 1313  WBC 8.7  HGB 8.5*  HCT 27.7*  MCV 95.5  PLT 201    Cardiac Enzymes: No results for input(s): CKTOTAL, CKMB, CKMBINDEX, TROPONINI in the last 168 hours.  BNP: Invalid input(s): POCBNP  CBG: No results for input(s): GLUCAP in the last 168 hours.  Microbiology: Results for orders placed or performed during the hospital encounter of 2015/11/22  Culture, Urine     Status: Abnormal   Collection Time: 11/26/15  5:45 PM  Result Value Ref Range Status   Specimen Description URINE, RANDOM  Final   Special Requests NONE  Final   Culture MULTIPLE SPECIES PRESENT, SUGGEST RECOLLECTION (A)  Final   Report Status   FINAL  Final    Coagulation Studies: No results for input(s): LABPROT, INR in the last 72 hours.  Urinalysis:  Recent Labs  11/26/15 1745  COLORURINE YELLOW  LABSPEC 1.022  PHURINE 5.5  GLUCOSEU NEGATIVE  HGBUR NEGATIVE  BILIRUBINUR NEGATIVE  KETONESUR NEGATIVE  PROTEINUR NEGATIVE  NITRITE NEGATIVE  LEUKOCYTESUR MODERATE*      Imaging: Dg Chest Port 1 View  Result Date: 11/26/2015 CLINICAL DATA:  Fever. EXAM: PORTABLE CHEST 1 VIEW COMPARISON:  11/16/2015 FINDINGS: Very low lung volumes with kyphosis further limited evaluation. Streaky infrahilar opacities favors atelectasis, aspiration or pneumonia not excluded. No edema, effusion, or pneumothorax. Tracheostomy largely obscured by chin. IMPRESSION: Very limited low volume chest. Infrahilar opacities could be atelectasis, aspiration, or pneumonia. Electronically Signed   By: Marnee Spring M.D.   On: 11/26/2015 13:43     Medications:       Assessment/ Plan:  55 y.o. female with a PMHx of seizure disorder, fibromyalgia, who was admitted originally to [facility] in May of 2017. At that time she was found to have pneumonia and started on antibiotics. She developed respiratory failure and failed to wean from mechanical ventilation. She was then transferred to [facility] for ventilator weaning. Unfortunately she then developed fever and hypotension and was found to have worsening  leukocytosis as well as acute renal failure. She was then sent over to [facility]. She was initially started on continuous renal replacement therapy. In addition she was found to have thrombocytopenia in the setting of multiple organ failure. She underwent plasma exchange x3. She was found to have Klebsiella sepsis.  1. Acute renal failure. This appears to be multifactorial. Patient was treated with continuous renal replacement therapy at [facility]. - renal function appears to be relatively stable.  Creatinine yesterday was 1.6.  Continue to periodically monitor renal function. We encouraged the patient to continue by mouth fluid hydration.  2. Acute respiratory failure. Patient appears to be breathing comfortably at the moment. There may be a question of infrahilar infiltrates on chest x-ray. Further management of this per hospitalist.  3. Hypokalemia. Potassium was down to 3.0 yesterday but now back up to 4.5 post-repletion. Continue to monitor.   LOS: 0 Diane Cunningham 8/9/20173:37 PM

## 2015-11-28 ENCOUNTER — Encounter: Payer: Medicaid Other | Admitting: Anesthesiology

## 2015-11-28 LAB — BLOOD GAS, ARTERIAL
ACID-BASE DEFICIT: 9.2 mmol/L — AB (ref 0.0–2.0)
BICARBONATE: 17.4 meq/L — AB (ref 20.0–24.0)
O2 Saturation: 76.9 %
PATIENT TEMPERATURE: 98.9
TCO2: 18.8 mmol/L (ref 0–100)
pCO2 arterial: 46.8 mmHg — ABNORMAL HIGH (ref 35.0–45.0)
pH, Arterial: 7.196 — CL (ref 7.350–7.450)
pO2, Arterial: 50.6 mmHg — ABNORMAL LOW (ref 80.0–100.0)

## 2015-11-28 MED FILL — Medication: Qty: 1 | Status: AC

## 2015-12-20 NOTE — Transfer of Care (Signed)
Immediate Anesthesia Transfer of Care Note  Patient: Diane NettersVickie B Tellefsen  INTUBATION DURING CODE CLUE

## 2015-12-20 NOTE — Anesthesia Procedure Notes (Signed)
Procedure Name: Intubation Date/Time: 09/28/2015 5:24 AM Performed by: Nicholos JohnsMCPHAIL, Sherena Machorro S Pre-anesthesia Checklist: Patient identified, Emergency Drugs available, Suction available, Patient being monitored and Timeout performed Patient Re-evaluated:Patient Re-evaluated prior to inductionOxygen Delivery Method: Ambu bag Preoxygenation: Pre-oxygenation with 100% oxygen Laryngoscope size: glidescope. Grade View: Grade I Tube type: Subglottic suction tube Tube size: 7.5 mm Number of attempts: 1 Airway Equipment and Method: Video-laryngoscopy Placement Confirmation: ETT inserted through vocal cords under direct vision,  CO2 detector and breath sounds checked- equal and bilateral Secured at: 23 cm Tube secured with: Tape Dental Injury: Teeth and Oropharynx as per pre-operative assessment

## 2015-12-20 DEATH — deceased

## 2016-07-31 IMAGING — CR DG CHEST 1V PORT
1 series · 1 of 1 positions shown · non-contrast
Comparison: None.

CLINICAL DATA: Respiratory failure.

EXAM:
PORTABLE CHEST 1 VIEW

[AP]
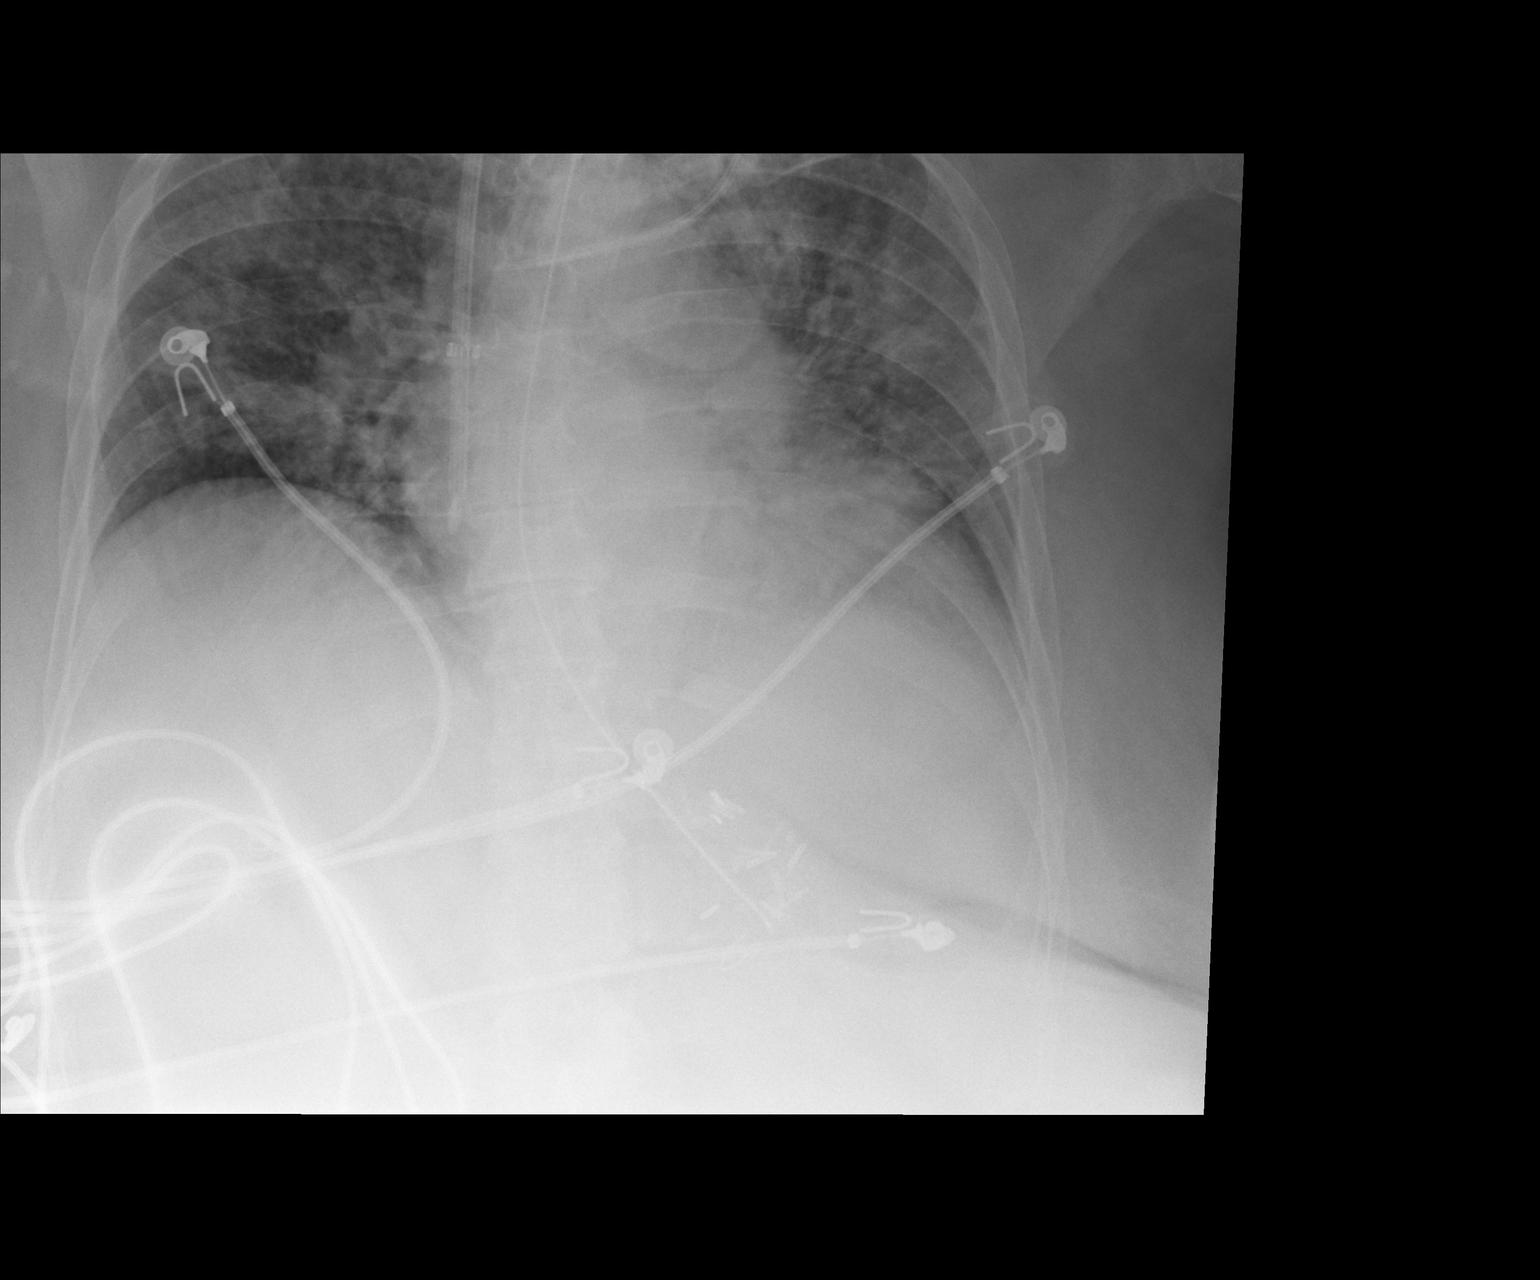

[1 of 1 positions shown; findings below may reference images not displayed]

FINDINGS: A tracheostomy tube, right IJ central venous catheter tube with tip
overlying the lower right atrium, left IJ central venous catheter
with tip overlying the brachiocephalic/ SVC junction, and NG tube
with tip overlying the proximal-mid stomach.

Bilateral airspace opacities are noted.

No definite large pleural effusions noted. No evidence of
pneumothorax noted.
IMPRESSION: Bilateral airspace opacities -question edema versus infection.

Support apparatus as described. Right IJ central venous catheter
with tip overlying the lower right atrium.

## 2016-08-24 IMAGING — CR DG CHEST 1V PORT
1 series · 1 of 1 positions shown · non-contrast
Comparison: 11/16/2015

CLINICAL DATA: Fever.

EXAM:
PORTABLE CHEST 1 VIEW

[AP]
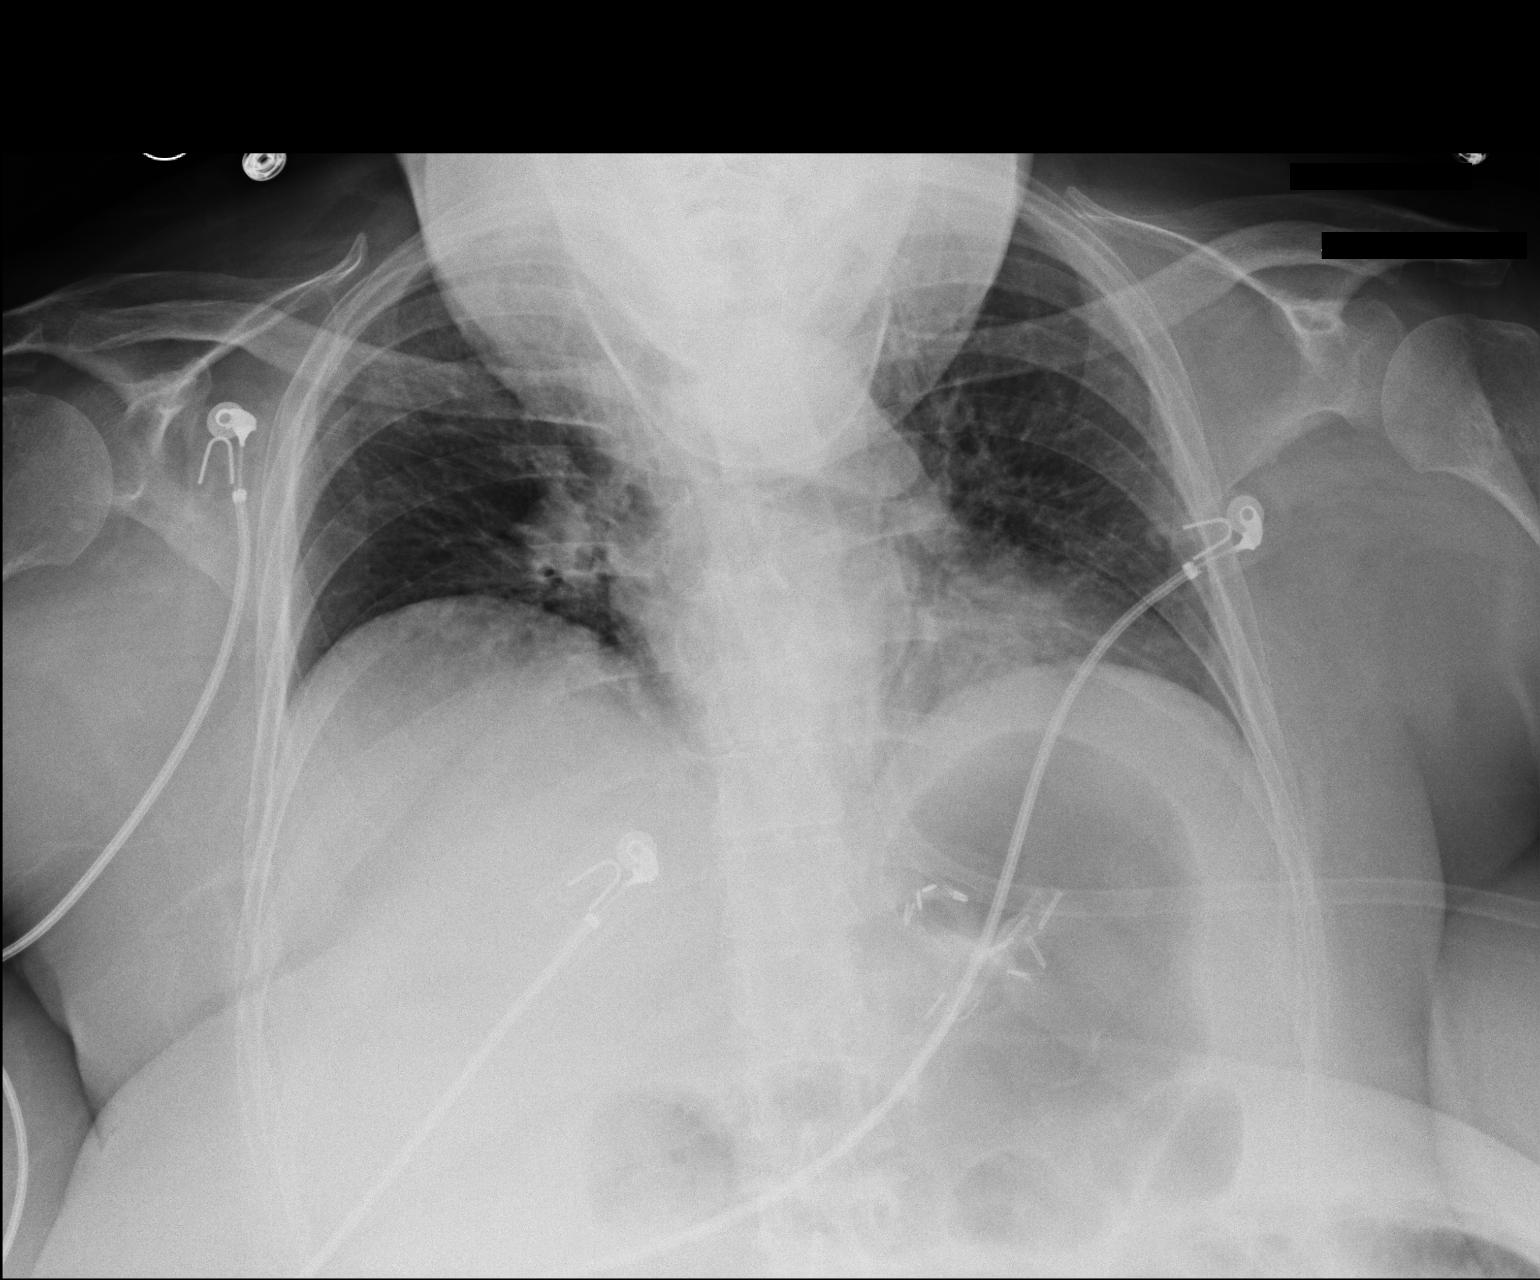

[1 of 1 positions shown; findings below may reference images not displayed]

FINDINGS: Very low lung volumes with kyphosis further limited evaluation.
Streaky infrahilar opacities favors atelectasis, aspiration or
pneumonia not excluded. No edema, effusion, or pneumothorax.
Tracheostomy largely obscured by chin.
IMPRESSION: Very limited low volume chest. Infrahilar opacities could be
atelectasis, aspiration, or pneumonia.
